# Patient Record
Sex: Female | Born: 1990 | Race: Black or African American | Hispanic: No | Marital: Single | Smoking: Never smoker
Health system: Southern US, Community
[De-identification: ages and names within clinical notes are randomized; demographics above are authoritative.]

---

## 2013-11-24 ENCOUNTER — Encounter (HOSPITAL_COMMUNITY): Payer: Self-pay | Admitting: Emergency Medicine

## 2013-11-24 ENCOUNTER — Emergency Department (HOSPITAL_COMMUNITY): Payer: BC Managed Care – PPO

## 2013-11-24 DIAGNOSIS — K219 Gastro-esophageal reflux disease without esophagitis: Secondary | ICD-10-CM | POA: Insufficient documentation

## 2013-11-24 NOTE — ED Notes (Signed)
The pt  Is c/o central chest pain since Friday with some sob.  None now

## 2013-11-25 ENCOUNTER — Emergency Department (HOSPITAL_COMMUNITY)
Admission: EM | Admit: 2013-11-25 | Discharge: 2013-11-25 | Disposition: A | Discharge status: Home / Self Care | Payer: BC Managed Care – PPO | Attending: Emergency Medicine | Admitting: Emergency Medicine

## 2013-11-25 DIAGNOSIS — K219 Gastro-esophageal reflux disease without esophagitis: Secondary | ICD-10-CM

## 2013-11-25 MED ORDER — PANTOPRAZOLE SODIUM 40 MG PO TBEC
40.0000 mg | DELAYED_RELEASE_TABLET | Freq: Once | ORAL | Status: AC
  Administered 2013-11-25: 40 mg via ORAL
  Filled 2013-11-25: qty 1

## 2013-11-25 MED ORDER — LANSOPRAZOLE 15 MG PO CPDR: 15.0000 mg | DELAYED_RELEASE_CAPSULE | Freq: Every day | ORAL | Status: AC

## 2013-11-25 NOTE — ED Provider Notes (Signed)
CSN: [id number]     Arrival date & time 11/24/13  2240 History   First MD Initiated Contact with Patient 11/25/13 0120     Chief Complaint  Patient presents with  . Chest Pain   (Consider location/radiation/quality/duration/timing/severity/associated sxs/prior Treatment) HPI This is a 22 year old female with a two-day history of chest pain. The pain is located substernally, does not radiate, and is described as a pressure or heartburn. It is relieved by ginger ale but not food or TUMS. There is no associated nausea, vomiting, diarrhea, diaphoresis or shortness of breath. The symptoms are mild to moderate.   History reviewed. No pertinent past medical history. History reviewed. No pertinent past surgical history. No family history on file. History  Substance Use Topics  . Smoking status: Never Smoker   . Smokeless tobacco: Not on file  . Alcohol Use: No   OB History   Grav Para Term Preterm Abortions TAB SAB Ect Mult Living                 Review of Systems  All other systems reviewed and are negative.    Allergies  Review of patient's allergies indicates no known allergies.  Home Medications   Current Outpatient Rx  Name  Route  Sig  Dispense  Refill  . ibuprofen (ADVIL,MOTRIN) 200 MG tablet   Oral   Take 400 mg by mouth every 6 (six) hours as needed for mild pain.          BP 128/77  Pulse 89  Temp(Src) 98.2 F (36.8 C) (Oral)  Resp 16  Ht 5' 4.5" (1.638 m)  Wt 164 lb (74.39 kg)  BMI 27.73 kg/m2  SpO2 100%  LMP 11/13/2013  Physical Exam General: Well-developed, well-nourished female in no acute distress; appearance consistent with age of record HENT: normocephalic; atraumatic Eyes: pupils equal, round and reactive to light; extraocular muscles intact Neck: supple Heart: regular rate and rhythm; no murmurs, rubs or gallops Lungs: clear to auscultation bilaterally Chest: Nontender Abdomen: soft; nondistended; nontender; no masses or hepatosplenomegaly;  bowel sounds present Extremities: No deformity; full range of motion Neurologic: Awake, alert and oriented; motor function intact in all extremities and symmetric; no facial droop Skin: Warm and dry Psychiatric: Normal mood and affect    ED Course  Procedures (including critical care time)   MDM  Nursing notes and vitals signs, including pulse oximetry, reviewed.  Summary of this visit's results, reviewed by myself:  EKG Interpretation    Date/Time:  Sunday November 24 2013 22:47:49 EST Ventricular Rate:  84 PR Interval:  132 QRS Duration: 84 QT Interval:  366 QTC Calculation: 432 R Axis:   83 Text Interpretation:  Normal sinus rhythm Possible Left atrial enlargement Borderline ECG No old tracing to compare Confirmed by Ernesto Zukowski  MD, Rohini Jaroszewski ([id number]) on 11/25/2013 1:21:03 AM     Imaging Studies: Dg Chest 2 View  11/24/2013   CLINICAL DATA:  Mid chest pressure for 3 days.  EXAM: CHEST  2 VIEW  COMPARISON:  None.  FINDINGS: The heart size and mediastinal contours are within normal limits. Both lungs are clear. The visualized skeletal structures are unremarkable.  IMPRESSION: No active cardiopulmonary disease.   Electronically Signed   By: Rosalie Gums M.D.   On: 11/24/2013 23:23   Patient symptoms are most likely GERD given her age. We'll start her on a PPI.     Carlisle Beers Trayven Lumadue, MD 11/25/13 0130

## 2013-11-25 NOTE — ED Notes (Addendum)
Pt states that she has been having chest pain since Friday night. Pt initially thought it was indigestion and took tums and gingerale on Saturday morning with relief. Pain returned Saturday night. Pt then called nurse on call tonight and was instructed to come to ED. Denies N/V.

## 2014-01-09 ENCOUNTER — Encounter (HOSPITAL_COMMUNITY): Payer: Self-pay | Admitting: Emergency Medicine

## 2014-01-09 ENCOUNTER — Emergency Department (HOSPITAL_COMMUNITY)
Admission: EM | Admit: 2014-01-09 | Discharge: 2014-01-09 | Disposition: A | Discharge status: Home / Self Care | Payer: BC Managed Care – PPO | Attending: Emergency Medicine | Admitting: Emergency Medicine

## 2014-01-09 DIAGNOSIS — Z79899 Other long term (current) drug therapy: Secondary | ICD-10-CM | POA: Insufficient documentation

## 2014-01-09 DIAGNOSIS — J029 Acute pharyngitis, unspecified: Secondary | ICD-10-CM | POA: Insufficient documentation

## 2014-01-09 LAB — RAPID STREP SCREEN (MED CTR MEBANE ONLY): Streptococcus, Group A Screen (Direct): NEGATIVE

## 2014-01-09 NOTE — ED Notes (Signed)
She has had a cold and a sorethroat since yesterday

## 2014-01-09 NOTE — ED Provider Notes (Signed)
CSN: [id number]     Arrival date & time 01/09/14  0434 History   First MD Initiated Contact with Patient 01/09/14 [phone]     Chief Complaint  Patient presents with  . Sore Throat   (Consider location/radiation/quality/duration/timing/severity/associated sxs/prior Treatment) HPI Patient is a 23 -year-old woman who presents with complaints of sore throat which began yesterday evening. Her discomfort is mild to moderate. She has a sensation of fullness in the ears. No fever. No nasal congestion or rhinitis. No cough. She is able to eat and drink without difficulty. The patient says she'll get her tonsils in the mirror and became concerned because it looked enlarged to her. She has not appreciated any exudate. No unilateral pain.  History reviewed. No pertinent past medical history. History reviewed. No pertinent past surgical history. No family history on file. History  Substance Use Topics  . Smoking status: Never Smoker   . Smokeless tobacco: Not on file  . Alcohol Use: No   OB History   Grav Para Term Preterm Abortions TAB SAB Ect Mult Living                 Review of Systems Ten point review of symptoms performed and is negative with the exception of symptoms noted above.   Allergies  Review of patient's allergies indicates no known allergies.  Home Medications   Current Outpatient Rx  Name  Route  Sig  Dispense  Refill  . aspirin-acetaminophen-caffeine (EXCEDRIN EXTRA STRENGTH) 250-250-65 MG per tablet   Oral   Take 2 tablets by mouth every 6 (six) hours as needed for headache.         . ibuprofen (ADVIL,MOTRIN) 200 MG tablet   Oral   Take 400 mg by mouth every 6 (six) hours as needed for mild pain.         Marland Kitchen. lansoprazole (PREVACID) 15 MG capsule   Oral   Take 1 capsule (15 mg total) by mouth daily.         Marland Kitchen. PRESCRIPTION MEDICATION   Oral   Take 1 tablet by mouth daily. Birth control that starts with a F         . Pseudoeph-Doxylamine-DM-APAP (NYQUIL PO)  Oral   Take 30 mLs by mouth at bedtime as needed (cold).          BP 127/80  Pulse 74  Temp(Src) 98.1 F (36.7 C)  Resp 18  Ht 5' 4.5" (1.638 m)  Wt 174 lb (78.926 kg)  BMI 29.42 kg/m2  SpO2 99% Physical Exam Gen: well developed and well nourished appearing Head: NCAT Eyes: PERL, EOMI Nose: no epistaixis or rhinorrhea Mouth/throat: mucosa is moist and pink, tonsils appear normal, no exudate. Uvula is midline. Neck: supple, no stridor, no cervical adenopathy Lungs: CTA B, no wheezing, rhonchi or rales CV: RRR, no murmur, extremities appear well perfused.  Abd: soft, notender, nondistended Back: no ttp, no cva ttp Skin: warm and dry Ext: normal to inspection, no dependent edema Neuro: CN ii-xii grossly intact, no focal deficits Psyche; normal affect,  calm and cooperative.   ED Course  Procedures (including critical care time) Labs Results  Results for orders placed during the hospital encounter of 01/09/14 (from the past 24 hour(s))  RAPID STREP SCREEN     Status: None   Collection Time    01/09/14  4:53 AM      Result Value Range   Streptococcus, Group A Screen (Direct) NEGATIVE  NEGATIVE     MDM  1. Pharyngitis        Brandt Loosen, MD 01/09/14 [id number]

## 2014-01-10 LAB — CULTURE, GROUP A STREP

## 2014-06-02 IMAGING — CR DG CHEST 2V
2 series · 2 of 2 positions shown · non-contrast
Comparison: None.

CLINICAL DATA: Mid chest pressure for 3 days.

EXAM:
CHEST  2 VIEW

[w chest pa]
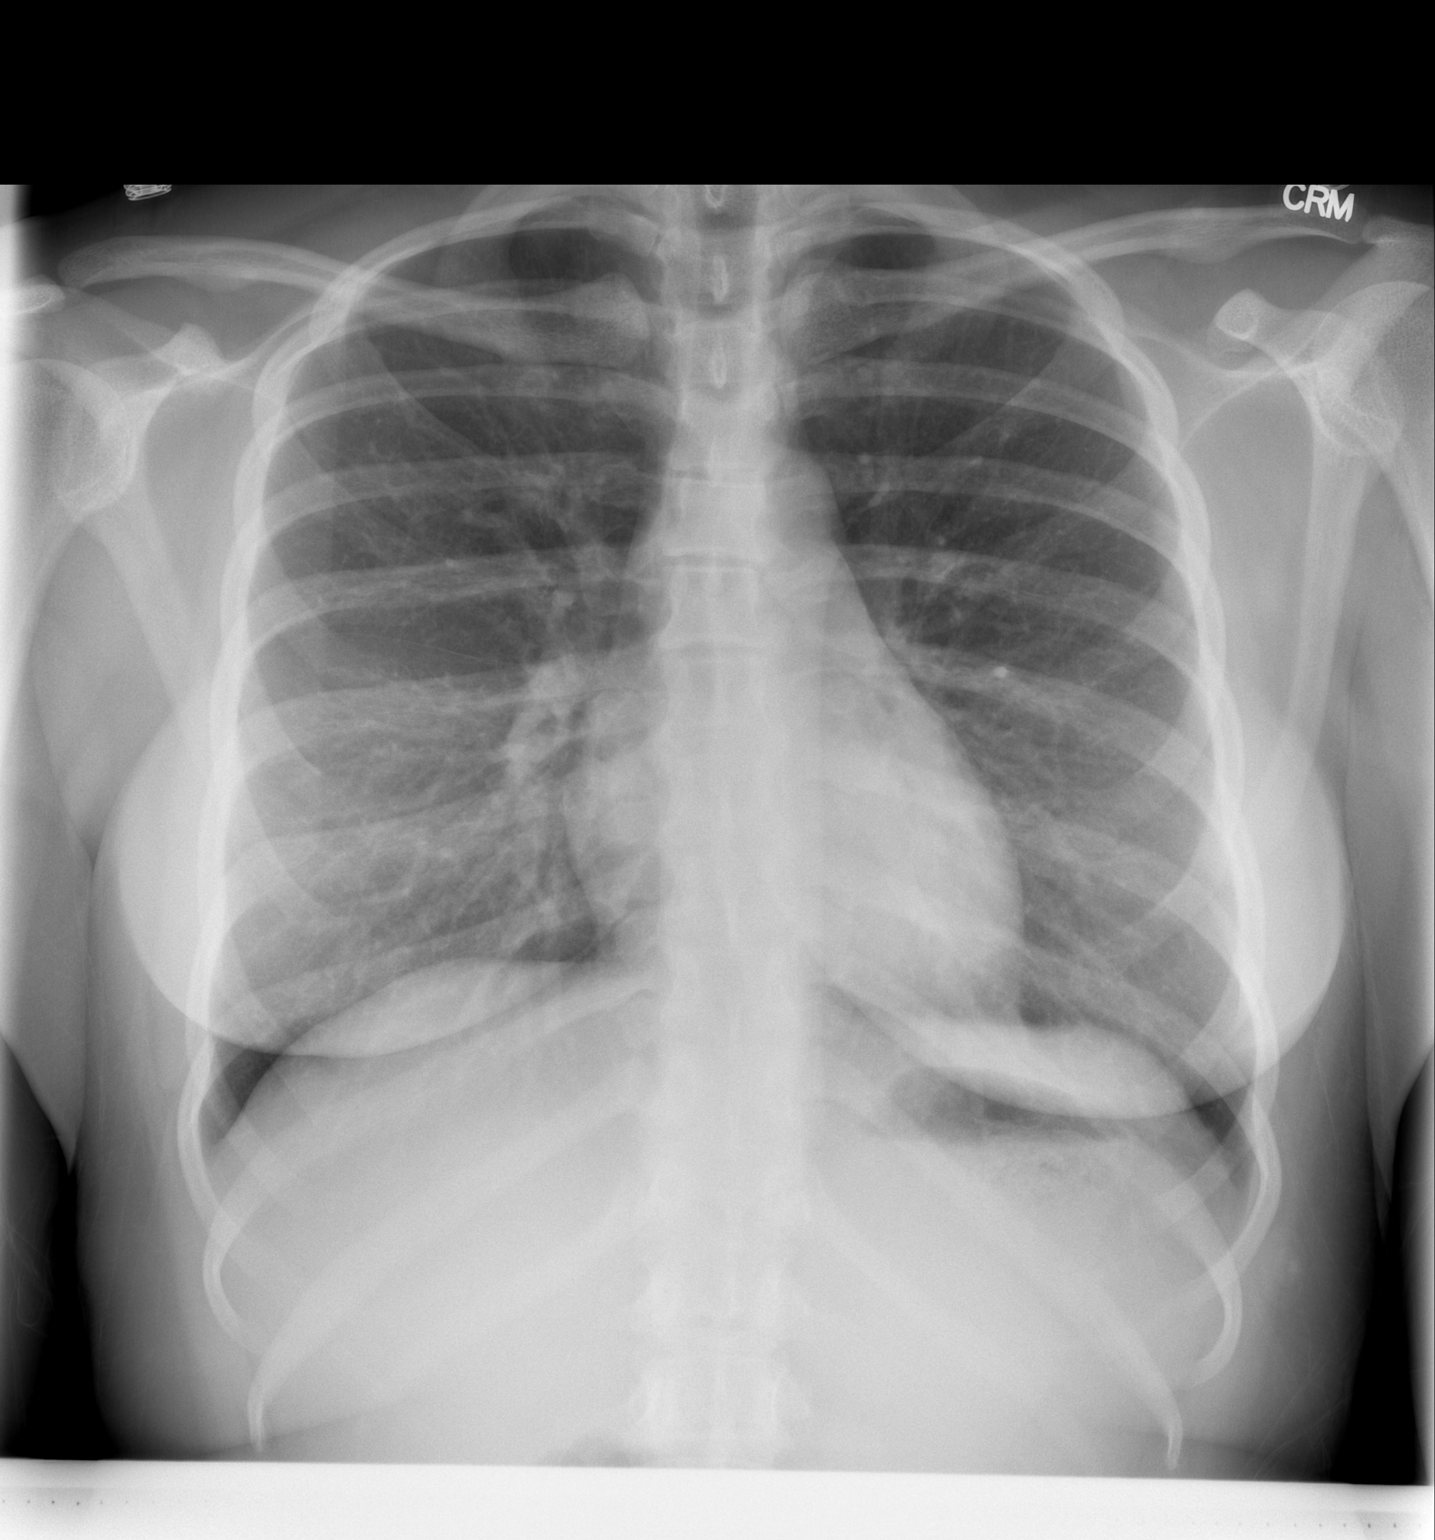

[w chest lat]
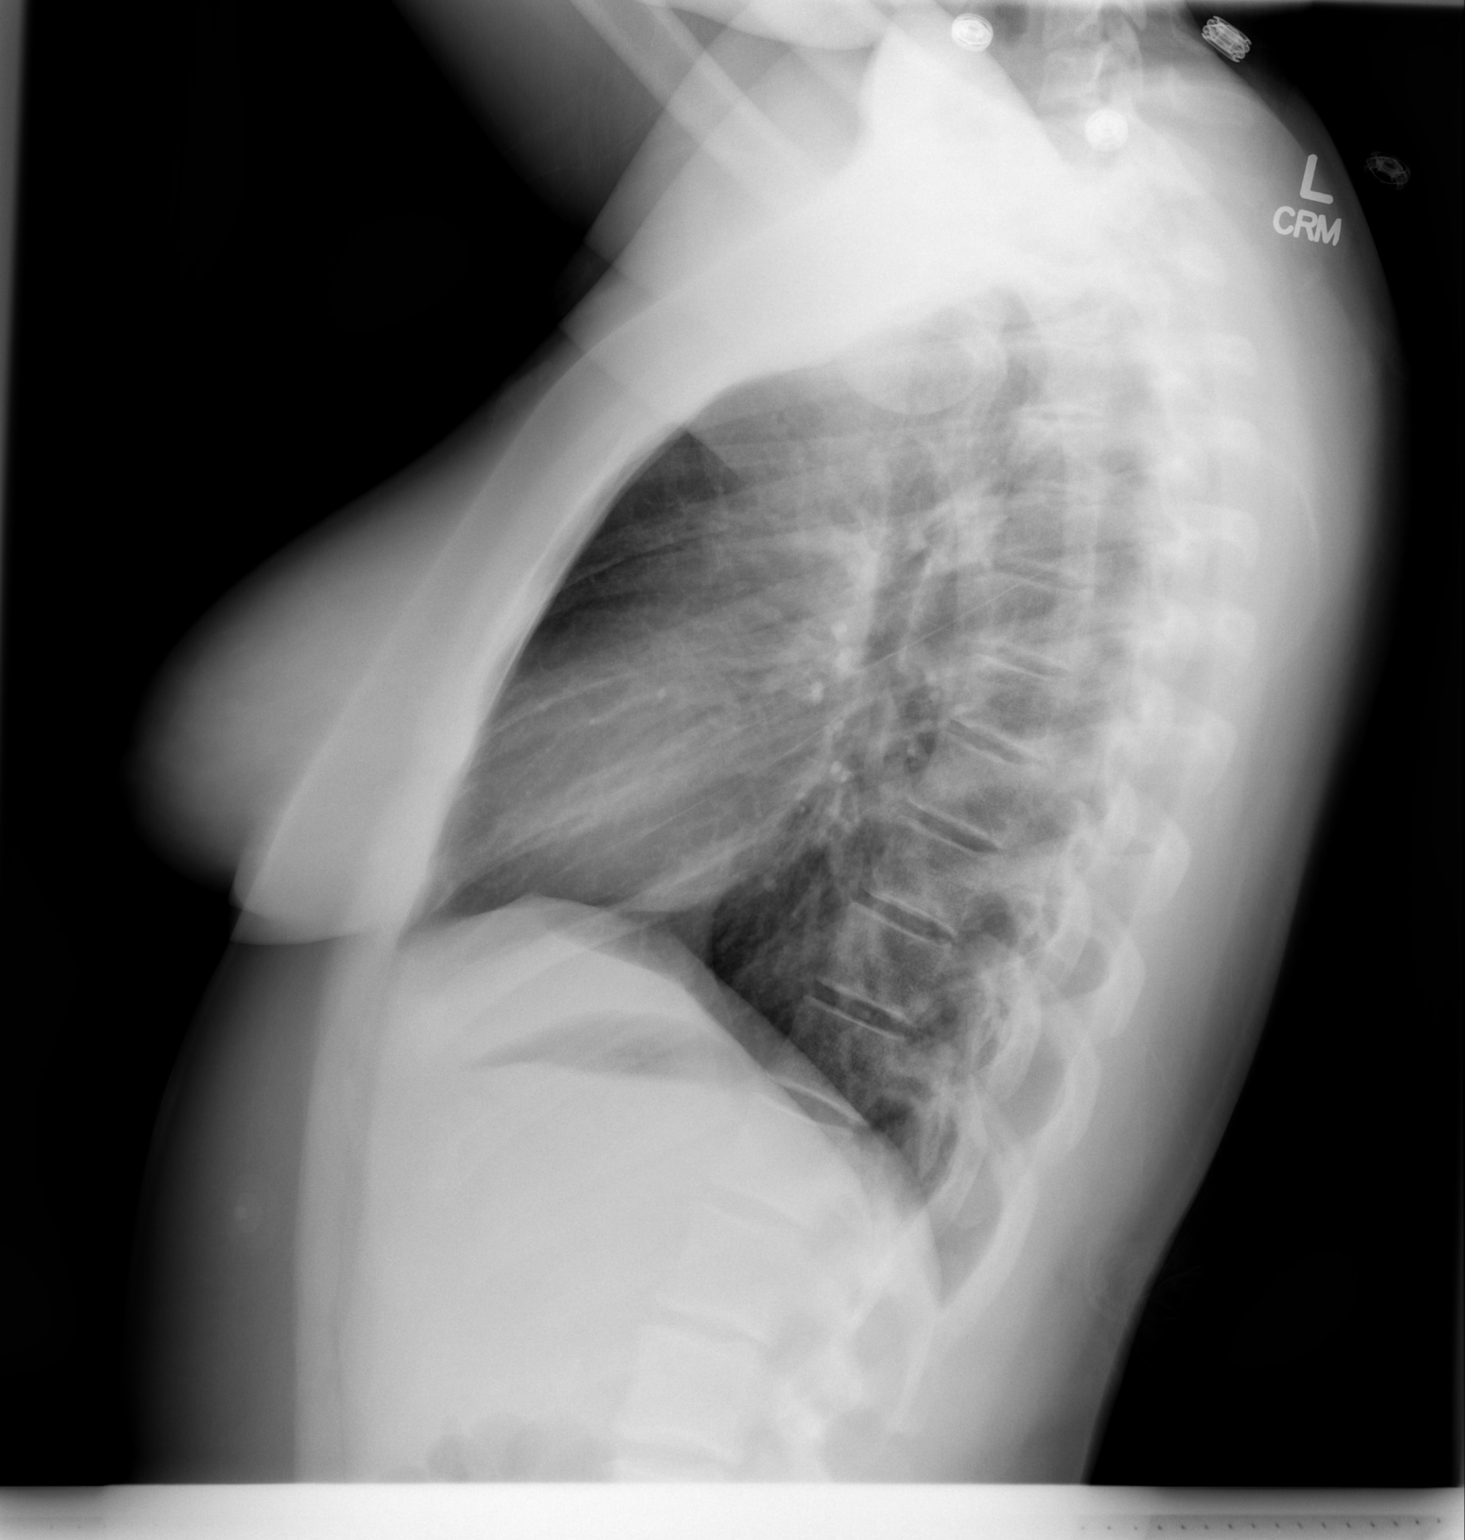

[2 of 2 positions shown; findings below may reference images not displayed]

FINDINGS: The heart size and mediastinal contours are within normal limits.
Both lungs are clear. The visualized skeletal structures are
unremarkable.
IMPRESSION: No active cardiopulmonary disease.
